# Patient Record
Sex: Male | Born: 1955 | Race: Black or African American | Hispanic: No | Marital: Married | Smoking: Never smoker
Health system: Southern US, Community
[De-identification: ages and names within clinical notes are randomized; demographics above are authoritative.]

## PROBLEM LIST (undated history)

## (undated) DIAGNOSIS — I10 Essential (primary) hypertension: Secondary | ICD-10-CM

---

## 1988-02-07 HISTORY — PX: INGUINAL HERNIA REPAIR: SUR1180

## 2010-09-08 ENCOUNTER — Inpatient Hospital Stay (INDEPENDENT_AMBULATORY_CARE_PROVIDER_SITE_OTHER)
Admission: RE | Admit: 2010-09-08 | Discharge: 2010-09-08 | Disposition: A | Discharge status: Home / Self Care | Payer: Federal, State, Local not specified - PPO | Source: Ambulatory Visit | Attending: Family Medicine | Admitting: Family Medicine

## 2010-09-08 DIAGNOSIS — R51 Headache: Secondary | ICD-10-CM

## 2010-09-08 DIAGNOSIS — R42 Dizziness and giddiness: Secondary | ICD-10-CM

## 2010-09-08 LAB — POCT I-STAT, CHEM 8
BUN: 15 mg/dL (ref 6–23)
Creatinine, Ser: 1 mg/dL (ref 0.50–1.35)
Sodium: 141 mEq/L (ref 135–145)
TCO2: 26 mmol/L (ref 0–100)

## 2019-03-28 ENCOUNTER — Ambulatory Visit: Payer: Federal, State, Local not specified - PPO | Attending: Internal Medicine

## 2019-03-28 DIAGNOSIS — Z23 Encounter for immunization: Secondary | ICD-10-CM | POA: Insufficient documentation

## 2019-03-28 NOTE — Progress Notes (Signed)
   Covid-19 Vaccination Clinic  Name:  Jermaine Oliver    MRN: [id number] DOB: 01-19-1956  03/28/2019  [name] was observed post Covid-19 immunization for 15 minutes without incidence. He was provided with Vaccine Information Sheet and instruction to access the V-Safe system.   [name] was instructed to call 911 with any severe reactions post vaccine: Marland Kitchen Difficulty breathing  . Swelling of your face and throat  . A fast heartbeat  . A bad rash all over your body  . Dizziness and weakness    Immunizations Administered    Name Date Dose VIS Date Route   Pfizer COVID-19 Vaccine 03/28/2019  1:27 PM 0.3 mL 01/17/2019 Intramuscular   Manufacturer: [employer], [employer]   Lot: [id number]   NDC: [address]-3

## 2019-04-22 ENCOUNTER — Ambulatory Visit: Payer: Federal, State, Local not specified - PPO | Attending: Internal Medicine

## 2019-04-22 DIAGNOSIS — Z23 Encounter for immunization: Secondary | ICD-10-CM

## 2019-04-22 NOTE — Progress Notes (Signed)
   Covid-19 Vaccination Clinic  Name:  Jermaine Oliver    MRN: [id number] DOB: 12/14/1955  04/22/2019  Mr. Jermaine Oliver was observed post Covid-19 immunization for 15 minutes without incident. He was provided with Vaccine Information Sheet and instruction to access the V-Safe system.   Mr. Jermaine Oliver was instructed to call 911 with any severe reactions post vaccine: Marland Kitchen Difficulty breathing  . Swelling of face and throat  . A fast heartbeat  . A bad rash all over body  . Dizziness and weakness   Immunizations Administered    Name Date Dose VIS Date Route   Pfizer COVID-19 Vaccine 04/22/2019  8:28 AM 0.3 mL 01/17/2019 Intramuscular   Manufacturer: [employer], [employer]   Lot: [id number]   NDC: [address]-8

## 2020-02-18 ENCOUNTER — Other Ambulatory Visit: Payer: Self-pay | Admitting: Family Medicine

## 2020-02-18 DIAGNOSIS — K4090 Unilateral inguinal hernia, without obstruction or gangrene, not specified as recurrent: Secondary | ICD-10-CM

## 2020-03-05 ENCOUNTER — Other Ambulatory Visit: Payer: Self-pay

## 2020-03-05 ENCOUNTER — Ambulatory Visit
Admission: RE | Admit: 2020-03-05 | Discharge: 2020-03-05 | Disposition: A | Discharge status: Home / Self Care | Payer: Federal, State, Local not specified - PPO | Source: Ambulatory Visit | Attending: Family Medicine | Admitting: Family Medicine

## 2020-03-05 DIAGNOSIS — K4090 Unilateral inguinal hernia, without obstruction or gangrene, not specified as recurrent: Secondary | ICD-10-CM

## 2020-04-05 ENCOUNTER — Ambulatory Visit: Payer: Self-pay | Admitting: Surgery

## 2020-04-05 ENCOUNTER — Encounter: Payer: Self-pay | Admitting: Surgery

## 2020-04-05 NOTE — H&P (Signed)
Jermaine Oliver Appointment: 04/05/2020 11:00 AM Location: [facility] Patient #: [id number] DOB: 02/12/1955 Married / Language: English / Race: Black or African American Male  History of Present Illness Ardeth Sportsman MD; 04/05/2020 11:31 AM) The patient is a 65 year old male who presents with an inguinal hernia. Note for "Inguinal hernia": ` ` ` Patient sent for surgical consultation at the request of Dr Knox Royalty  Chief Complaint: Left groin pain. Possible hernia. ` ` The patient is a patient with prior open right inguinal hernia repair by Dr. Dominga Ferry in the 1990s. Patient noted left groin pain and discomfort. Concern for hernia. Surgical consultation requested. Patient is a nonsmoker. 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 No sleep apnea diabetes.  Abdominal surgeries. Usually moves his bowels once or twice a day. Can walk at least half hour without difficulty. No nocturia or difficulty with urination. No history of prior infections. No history of cardiac or pulmonary issues. No history of stroke. No other abdominal surgeries beside his open inguinal hernia repair  (Review of systems as stated in this history (HPI) or in the review of systems. Otherwise all other 12 point ROS are negative) ` ` ###########################################`  This patient encounter took 30 minutes today to perform the following: obtain history, perform exam, review outside records, interpret tests & imaging, counsel the patient on their diagnosis; and, document this encounter, including findings & plan in the electronic health record (EHR).   Allergies (Alisha Spillers, CMA; 04/05/2020 10:47 AM) No Known Drug Allergies [04/05/2020]: Allergies Reconciled  Medication History (Alisha Spillers, CMA; 04/05/2020 10:47 AM) amLODIPine Besylate (10MG  Tablet, Oral) Active. Losartan Potassium-HCTZ (100-25MG  Tablet, Oral)  Active. Medications Reconciled    Vitals (Alisha Spillers CMA; 04/05/2020 10:48 AM) 04/05/2020 10:48 AM Weight: 141 lb Height: 804in Body Surface Area: 10.56 m Body Mass Index: 0.15 kg/m  Temp.: 98.77F(Oral)  Pulse: 82 (Regular)  Resp.: 18 (Unlabored)  P.OX: 99% (Room air) BP: 140/82(Sitting, Left Arm, Standard)        Physical Exam 04/07/2020 MD; 04/05/2020 11:25 AM)  General Mental Status-Alert. General Appearance-Not in acute distress, Not Sickly. Orientation-Oriented X3. Hydration-Well hydrated. Voice-Normal.  Integumentary Global Assessment Upon inspection and palpation of skin surfaces of the - Axillae: non-tender, no inflammation or ulceration, no drainage. and Distribution of scalp and body hair is normal. General Characteristics Temperature - normal warmth is noted.  Head and Neck Head-normocephalic, atraumatic with no lesions or palpable masses. Face Global Assessment - atraumatic, no absence of expression. Neck Global Assessment - no abnormal movements, no bruit auscultated on the right, no bruit auscultated on the left, no decreased range of motion, non-tender. Trachea-midline. Thyroid Gland Characteristics - non-tender.  Eye Eyeball - Left-Extraocular movements intact, No Nystagmus - Left. Eyeball - Right-Extraocular movements intact, No Nystagmus - Right. Cornea - Left-No Hazy - Left. Cornea - Right-No Hazy - Right. Sclera/Conjunctiva - Left-No scleral icterus, No Discharge - Left. Sclera/Conjunctiva - Right-No scleral icterus, No Discharge - Right. Pupil - Left-Direct reaction to light normal. Pupil - Right-Direct reaction to light normal.  ENMT Ears Pinna - Left - no drainage observed, no generalized tenderness observed. Pinna - Right - no drainage observed, no generalized tenderness observed. Nose and Sinuses External Inspection of the Nose - no destructive lesion observed. Inspection of the  nares - Left - quiet respiration. Inspection of the nares - Right - quiet respiration. Mouth and Throat Lips - Upper Lip - no fissures observed,  no pallor noted. Lower Lip - no fissures observed, no pallor noted. Nasopharynx - no discharge present. Oral Cavity/Oropharynx - Tongue - no dryness observed. Oral Mucosa - no cyanosis observed. Hypopharynx - no evidence of airway distress observed.  Chest and Lung Exam Inspection Movements - Normal and Symmetrical. Accessory muscles - No use of accessory muscles in breathing. Palpation Palpation of the chest reveals - Non-tender. Auscultation Breath sounds - Normal and Clear.  Cardiovascular Auscultation Rhythm - Regular. Murmurs & Other Heart Sounds - Auscultation of the heart reveals - No Murmurs and No Systolic Clicks.  Abdomen Inspection Inspection of the abdomen reveals - No Visible peristalsis and No Abnormal pulsations. Umbilicus - No Bleeding, No Urine drainage. Palpation/Percussion Palpation and Percussion of the abdomen reveal - Soft, Non Tender, No Rebound tenderness, No Rigidity (guarding) and No Cutaneous hyperesthesia. Note: Abdomen soft. Nontender. Not distended. No umbilical or incisional hernias. No guarding.  Male Genitourinary Sexual Maturity Tanner 5 - Adult hair pattern and Adult penile size and shape. Note: Obvious left inguinal hernia in groin only. Seems more lateral/indirect.  Mild bumpiness and right groin but no definite inguinal hernia there. Otherwise normal external male genitalia without any obvious testicular/epididymal masses.  Peripheral Vascular Upper Extremity Inspection - Left - No Cyanotic nailbeds - Left, Not Ischemic. Inspection - Right - No Cyanotic nailbeds - Right, Not Ischemic.  Neurologic Neurologic evaluation reveals -normal attention span and ability to concentrate, able to name objects and repeat phrases. Appropriate fund of knowledge , normal sensation and normal  coordination. Mental Status Affect - not angry, not paranoid. Cranial Nerves-Normal Bilaterally. Gait-Normal.  Neuropsychiatric Mental status exam performed with findings of-able to articulate well with normal speech/language, rate, volume and coherence, thought content normal with ability to perform basic computations and apply abstract reasoning and no evidence of hallucinations, delusions, obsessions or homicidal/suicidal ideation.  Musculoskeletal Global Assessment Spine, Ribs and Pelvis - no instability, subluxation or laxity. Right Upper Extremity - no instability, subluxation or laxity.  Lymphatic Head & Neck  General Head & Neck Lymphatics: Bilateral - Description - No Localized lymphadenopathy. Axillary  General Axillary Region: Bilateral - Description - No Localized lymphadenopathy. Femoral & Inguinal  Generalized Femoral & Inguinal Lymphatics: Left - Description - No Localized lymphadenopathy. Right - Description - No Localized lymphadenopathy.    Assessment & Plan Ardeth Sportsman MD; 04/05/2020 11:26 AM)  INGUINAL HERNIA OF LEFT SIDE WITHOUT OBSTRUCTION OR GANGRENE (K40.90) Impression: Small but definite left inguinal hernia. No evidence of recurrence on the right side status post open repair decades ago.  I think he would benefit from hernia repair. Reasonable for laparoscopic candidate. Double check to ensure no evidence of subtle recurrence or a femoral hernia on the right side. He is interested in proceeding. We'll work to coordinate convenient time.  He works in the post office using a upright lift. Sounds like moderate activity. Noted that most people can go back to light duty around 3 weeks and unrestricted around 6 weeks. We will see.   PREOP - ING HERNIA - ENCOUNTER FOR PREOPERATIVE EXAMINATION FOR GENERAL SURGICAL PROCEDURE (Z01.818)  Current Plans You are being scheduled for surgery- Our schedulers will call you.  You should hear from our  office's scheduling department within 5 working days about the location, date, and time of surgery. We try to make accommodations for patient's preferences in scheduling surgery, but sometimes the OR schedule or the surgeon's schedule prevents [place] from making those accommodations.  If you have not heard  from our office [phone]) in 5 working days, call the office and ask for your surgeon's nurse.  If you have other questions about your diagnosis, plan, or surgery, call the office and ask for your surgeon's nurse.  Written instructions provided The anatomy & physiology of the abdominal wall and pelvic floor was discussed. The pathophysiology of hernias in the inguinal and pelvic region was discussed. Natural history risks such as progressive enlargement, pain, incarceration, and strangulation was discussed. Contributors to complications such as smoking, obesity, diabetes, prior surgery, etc were discussed.  I feel the risks of no intervention will lead to serious problems that outweigh the operative risks; therefore, I recommended surgery to reduce and repair the hernia. I explained laparoscopic techniques with possible need for an open approach. I noted usual use of mesh to patch and/or buttress hernia repair  Risks such as bleeding, infection, abscess, need for further treatment, heart attack, death, and other risks were discussed. I noted a good likelihood this will help address the problem. Goals of post-operative recovery were discussed as well. Possibility that this will not correct all symptoms was explained. I stressed the importance of low-impact activity, aggressive pain control, avoiding constipation, & not pushing through pain to minimize risk of post-operative chronic pain or injury. Possibility of reherniation was discussed. We will work to minimize complications.  An educational handout further explaining the pathology & treatment options was given as well. Questions  were answered. The patient expresses understanding & wishes to proceed with surgery.  Pt Education - Pamphlet Given - Laparoscopic Hernia Repair: discussed with patient and provided information. Pt Education - [facility] Pain Control (Berton Butrick) Pt Education - [facility] Hernia Post-Op HCI (Shabree Tebbetts): discussed with patient and provided information. Pt Education - [facility] Mesh education: discussed with patient and provided information.  Ardeth Sportsman, MD, FACS, MASCRS  Gastrointestinal and Minimally Invasive Surgery  [facility] [address], [address] [place], [place] [address] [phone] Fax [phone] [address]/Paging  CONTACT INFORMATION: Weekday (9AM-5PM) concerns: Call [facility] main office at [phone] Weeknight (5PM-9AM) or Weekend/Holiday concerns: Check [email or web address] for General Surgery [facility] coverage (Please, do not use SecureChat as it is not reliable communication to operating surgeons for immediate patient care)

## 2020-06-23 SURGERY — IRRIGATION AND DEBRIDEMENT EXTREMITY
Anesthesia: Regional | Laterality: Right

## 2021-05-06 ENCOUNTER — Encounter (HOSPITAL_COMMUNITY): Payer: Self-pay | Admitting: Emergency Medicine

## 2021-05-06 ENCOUNTER — Emergency Department (HOSPITAL_COMMUNITY)
Admission: EM | Admit: 2021-05-06 | Discharge: 2021-05-07 | Disposition: A | Discharge status: Home / Self Care | Payer: Federal, State, Local not specified - PPO | Attending: Emergency Medicine | Admitting: Emergency Medicine

## 2021-05-06 ENCOUNTER — Other Ambulatory Visit: Payer: Self-pay

## 2021-05-06 DIAGNOSIS — R42 Dizziness and giddiness: Secondary | ICD-10-CM | POA: Diagnosis not present

## 2021-05-06 DIAGNOSIS — I10 Essential (primary) hypertension: Secondary | ICD-10-CM | POA: Diagnosis present

## 2021-05-06 HISTORY — DX: Essential (primary) hypertension: I10

## 2021-05-06 LAB — URINALYSIS, ROUTINE W REFLEX MICROSCOPIC
Bacteria, UA: NONE SEEN
Bilirubin Urine: NEGATIVE
Glucose, UA: NEGATIVE mg/dL
Ketones, ur: NEGATIVE mg/dL
Leukocytes,Ua: NEGATIVE
Nitrite: NEGATIVE
Protein, ur: NEGATIVE mg/dL
Specific Gravity, Urine: 1.013 (ref 1.005–1.030)
pH: 5 (ref 5.0–8.0)

## 2021-05-06 LAB — BASIC METABOLIC PANEL
Anion gap: 13 (ref 5–15)
BUN: 15 mg/dL (ref 8–23)
CO2: 25 mmol/L (ref 22–32)
Calcium: 9.3 mg/dL (ref 8.9–10.3)
Chloride: 98 mmol/L (ref 98–111)
Creatinine, Ser: 1.22 mg/dL (ref 0.61–1.24)
GFR, Estimated: >60 mL/min (ref >60)
Glucose, Bld: 94 mg/dL (ref 70–99)
Potassium: 3.1 mmol/L — ABNORMAL LOW (ref 3.5–5.1)
Sodium: 136 mmol/L (ref 135–145)

## 2021-05-06 LAB — CBC WITH DIFFERENTIAL/PLATELET
Abs Immature Granulocytes: 0.03 10*3/uL (ref 0.00–0.07)
Basophils Absolute: 0 10*3/uL (ref 0.0–0.1)
Basophils Relative: 0 %
Eosinophils Absolute: 0.8 10*3/uL — ABNORMAL HIGH (ref 0.0–0.5)
Eosinophils Relative: 9 %
HCT: 40.6 % (ref 39.0–52.0)
Hemoglobin: 13 g/dL (ref 13.0–17.0)
Immature Granulocytes: 0 %
Lymphocytes Relative: 21 %
Lymphs Abs: 1.7 10*3/uL (ref 0.7–4.0)
MCH: 23.1 pg — ABNORMAL LOW (ref 26.0–34.0)
MCHC: 32 g/dL (ref 30.0–36.0)
MCV: 72.2 fL — ABNORMAL LOW (ref 80.0–100.0)
Monocytes Absolute: 0.7 10*3/uL (ref 0.1–1.0)
Monocytes Relative: 8 %
Neutro Abs: 5.1 10*3/uL (ref 1.7–7.7)
Neutrophils Relative %: 62 %
Platelets: 316 10*3/uL (ref 150–400)
RBC: 5.62 MIL/uL (ref 4.22–5.81)
RDW: 15.5 % (ref 11.5–15.5)
WBC: 8.2 10*3/uL (ref 4.0–10.5)
nRBC: 0 % (ref 0.0–0.2)

## 2021-05-06 NOTE — ED Provider Triage Note (Signed)
Emergency Medicine Provider Triage Evaluation Note ? ?Jermaine Oliver , a 66 y.o. male  was evaluated in triage.  Pt complains of high blood pressure.  Patient states that he is at home and record his blood pressure which was 171/100.  Patient states that he checks his blood pressure every night, noticed it was high tonight.  Patient presented to ED for evaluation.  Patient complaining of lightheadedness.  Patient denies any chest pain, shortness of breath, nausea, vomiting, blurred vision, headache. ? ?Review of Systems  ?Positive: Lightheaded ?Negative: Chest pain, shortness of breath, headache, blurred vision, back pain, nausea, vomiting ? ?Physical Exam  ?Ht 5\' 7"  (1.702 m)   Wt 64.9 kg   BMI 22.40 kg/m?  ?Gen:   Awake, no distress   ?Resp:  Normal effort  ?MSK:   Moves extremities without difficulty  ?Other:  No focal neurodeficits noted ? ?Medical Decision Making  ?Medically screening exam initiated at 9:53 PM.  Appropriate orders placed.  Jermaine Oliver was informed that the remainder of the evaluation will be completed by another provider, this initial triage assessment does not replace that evaluation, and the importance of remaining in the ED until their evaluation is complete. ? ? ?  ?Azucena Cecil, PA-C ?05/06/21 2155 ? ?

## 2021-05-06 NOTE — ED Triage Notes (Signed)
Pt c/o hypertension since this AM. Highest BP 171/100. Pt states that he is compliant with his medications ?

## 2021-05-07 NOTE — ED Provider Notes (Signed)
?[facility]ENCY DEPT ?[facility] Emergency Department ?Provider Note ?MRN:  [id number]  ?Arrival date & time: 05/07/21    ? ?Chief Complaint   ?Hypertension ?  ?History of Present Illness   ?Jermaine Oliver is a 66 y.o. year-old male with a history of hypertension presenting to the ED with chief complaint of hypertension. ? ?Elevated blood pressure throughout the day today.  Noted some mild lightheadedness occasionally.  Here for evaluation.  No chest pain, no shortness of breath, no recent illnesses or fever, no numbness or weakness to the arms or legs ? ?Review of Systems  ?A thorough review of systems was obtained and all systems are negative except as noted in the HPI and PMH.  ? ?Patient's Health History   ? ?Past Medical History:  ?Diagnosis Date  ? Hypertension   ?  ?Past Surgical History:  ?Procedure Laterality Date  ? INGUINAL HERNIA REPAIR Right 1990  ? Dr Lurene Shadow  ?  ?No family history on file.  ?Social History  ? ?Socioeconomic History  ? Marital status: Married  ?  Spouse name: Not on file  ? Number of children: Not on file  ? Years of education: Not on file  ? Highest education level: Not on file  ?Occupational History  ? Not on file  ?Tobacco Use  ? Smoking status: Never  ? Smokeless tobacco: Never  ?Substance and Sexual Activity  ? Alcohol use: Not Currently  ? Drug use: Not Currently  ? Sexual activity: Not on file  ?Other Topics Concern  ? Not on file  ?Social History Narrative  ? Not on file  ? ?Social Determinants of Health  ? ?Financial Resource Strain: Not on file  ?Food Insecurity: Not on file  ?Transportation Needs: Not on file  ?Physical Activity: Not on file  ?Stress: Not on file  ?Social Connections: Not on file  ?Intimate Partner Violence: Not on file  ?  ? ?Physical Exam  ? ?Vitals:  ? 05/07/21 0115 05/07/21 0130  ?BP: 139/80 135/77  ?Pulse: 63 62  ?Resp: 15 16  ?Temp:    ?SpO2: 97% 98%  ?  ?CONSTITUTIONAL: Well-appearing, NAD ?NEURO/PSYCH:  Alert and oriented x 3, normal and  symmetric strength and sensation, normal coordination, normal speech ?EYES:  eyes equal and reactive ?ENT/NECK:  no LAD, no JVD ?CARDIO: Regular rate, well-perfused, normal S1 and S2 ?PULM:  CTAB no wheezing or rhonchi ?GI/GU:  non-distended, non-tender ?MSK/SPINE:  No gross deformities, no edema ?SKIN:  no rash, atraumatic ? ? ?*Additional and/or pertinent findings included in MDM below ? ?Diagnostic and Interventional Summary  ? ? EKG Interpretation ? ?Date/Time:  Saturday May 07 2021 00:49:52 EDT ?Ventricular Rate:  61 ?PR Interval:  242 ?QRS Duration: 99 ?QT Interval:  438 ?QTC Calculation: 442 ?R Axis:   11 ?Text Interpretation: Sinus rhythm Prolonged PR interval Confirmed by Kennis Carina [phone]) on 05/07/2021 1:35:04 AM ?  ? ?  ? ?Labs Reviewed  ?CBC WITH DIFFERENTIAL/PLATELET - Abnormal; Notable for the following components:  ?    Result Value  ? MCV 72.2 (*)   ? MCH 23.1 (*)   ? Eosinophils Absolute 0.8 (*)   ? All other components within normal limits  ?BASIC METABOLIC PANEL - Abnormal; Notable for the following components:  ? Potassium 3.1 (*)   ? All other components within normal limits  ?URINALYSIS, ROUTINE W REFLEX MICROSCOPIC - Abnormal; Notable for the following components:  ? Hgb urine dipstick SMALL (*)   ? All other components within  normal limits  ?  ?No orders to display  ?  ?Medications - No data to display  ? ?Procedures  /  Critical Care ?Procedures ? ?ED Course and Medical Decision Making  ?Initial Impression and Ddx ?Largely a visit for asymptomatic hypertension.  Mild lightheadedness, no headache, no vision change, completely normal neurological exam, no chest pain or shortness of breath.  Screening EKG and labs are reassuring, no significant blood count or electrolyte disturbance.  Appropriate for discharge with reassurance. ? ?Past medical/surgical history that increases complexity of ED encounter: Hypertension ? ?Interpretation of Diagnostics ?I personally reviewed the EKG and my  interpretation is as follows: Sinus rhythm ?   ? ? ?Patient Reassessment and Ultimate Disposition/Management ?Discharge home ? ?Patient management required discussion with the following services or consulting groups:  None ? ?Complexity of Problems Addressed ?Acute illness or injury that poses threat of life of bodily function ? ?Additional Data Reviewed and Analyzed ?Further history obtained from: ?Past medical history and medications listed in the EMR ? ?Additional Factors Impacting ED Encounter Risk ?None ? ?Elmer Sow. Pilar Plate, MD ?[facility] Emergency Medicine ?[facility] [email or web address] ? ?Final Clinical Impressions(s) / ED Diagnoses  ? ?  ICD-10-CM   ?1. Hypertension, unspecified type  I10   ?  ?  ?ED Discharge Orders   ? ? None  ? ?  ?  ? ?Discharge Instructions Discussed with and Provided to Patient:  ? ? ?Discharge Instructions   ? ?  ?You were evaluated in the Emergency Department and after careful evaluation, we did not find any emergent condition requiring admission or further testing in the hospital. ? ?Your exam/testing today was overall reassuring.  Recommend follow-up with your primary care doctor to discuss your symptoms and blood pressure. ? ?Please return to the Emergency Department if you experience any worsening of your condition.  Thank you for allowing [place] to be a part of your care. ? ? ? ? ?  ?Sabas Sous, MD ?05/07/21 [phone] ? ?

## 2021-05-07 NOTE — Discharge Instructions (Signed)
You were evaluated in the Emergency Department and after careful evaluation, we did not find any emergent condition requiring admission or further testing in the hospital. ? ?Your exam/testing today was overall reassuring.  Recommend follow-up with your primary care doctor to discuss your symptoms and blood pressure. ? ?Please return to the Emergency Department if you experience any worsening of your condition.  Thank you for allowing [place] to be a part of your care. ? ?

## 2022-06-04 IMAGING — CT CT ABD-PELV W/O CM
1 of 2 series · 14 of 32 positions shown, 19 images · non-contrast
Comparison: None.

CLINICAL DATA: Evaluate for left inguinal hernia. Complains of
burning sensation in left groin region with some swelling.

EXAM:
CT ABDOMEN AND PELVIS WITHOUT CONTRAST
TECHNIQUE: Multidetector CT imaging of the abdomen and pelvis was performed
following the standard protocol without IV contrast.

[Series 2: abd/pelvis w/(date) · axial · 0.67mm/px · z∈[-465,-60]mm · 14 of 93 slices shown, 19 images]
[im 6/93  soft-tissue]
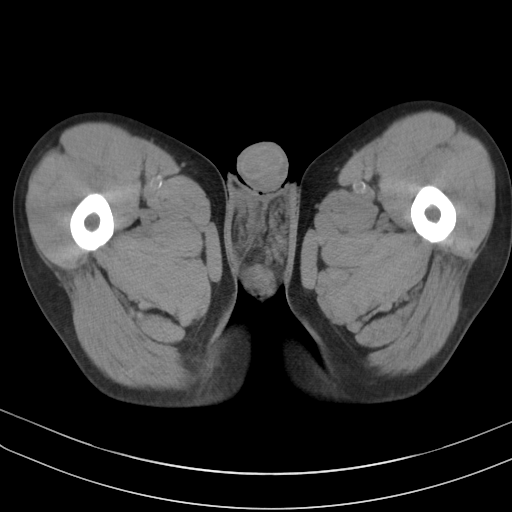
[im 6/93  bone]
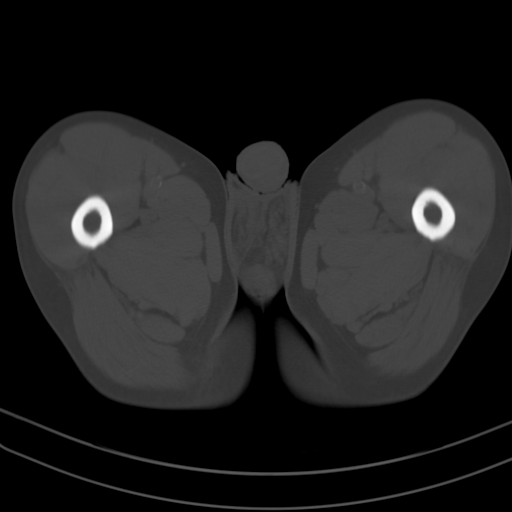
[im 11/93  soft-tissue]
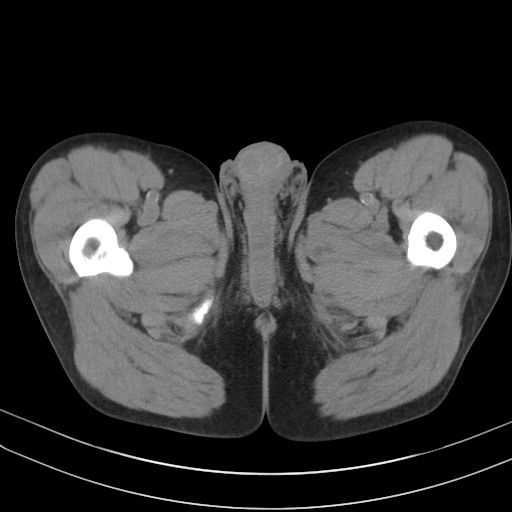
[im 21/93  soft-tissue]
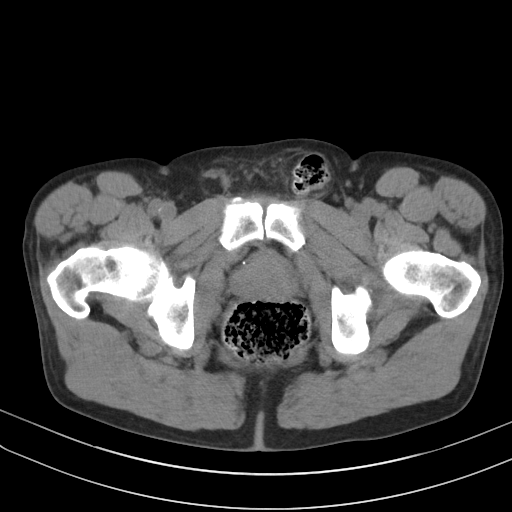
[im 26/93  soft-tissue]
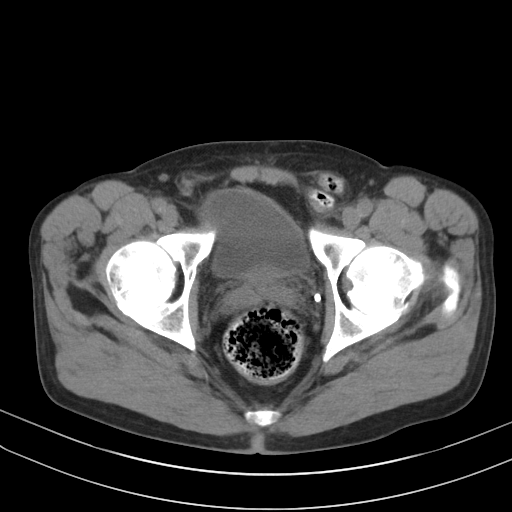
[im 31/93  soft-tissue]
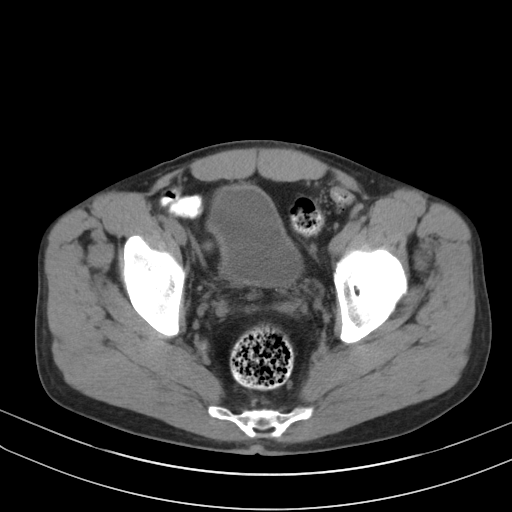
[im 41/93  soft-tissue]
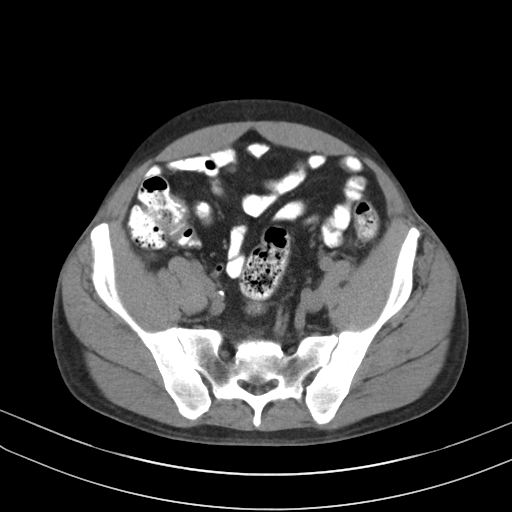
[im 47/93  soft-tissue]
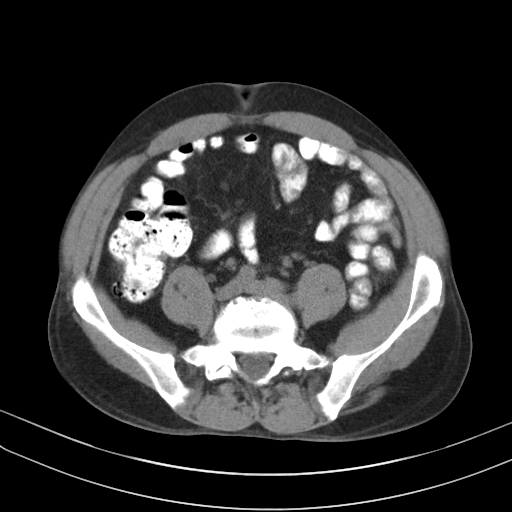
[im 52/93  soft-tissue]
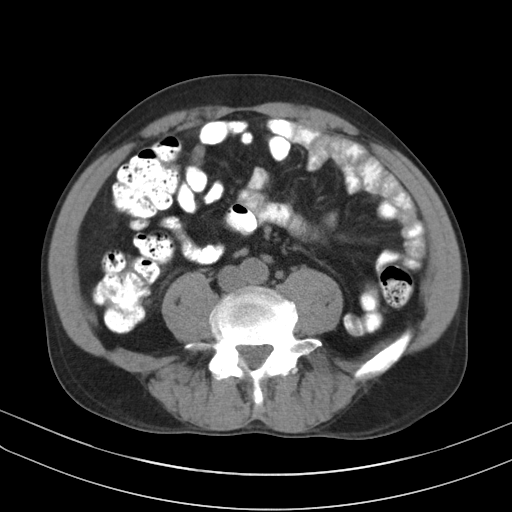
[im 62/93  soft-tissue]
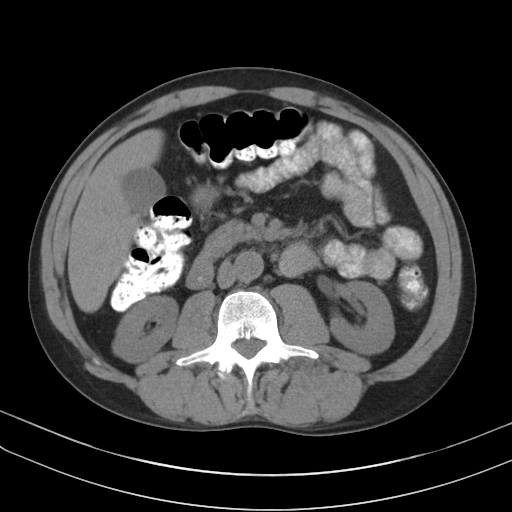
[im 62/93  bone]
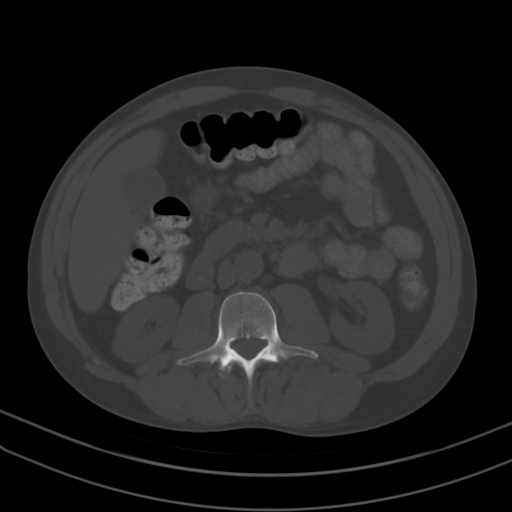
[im 67/93  soft-tissue]
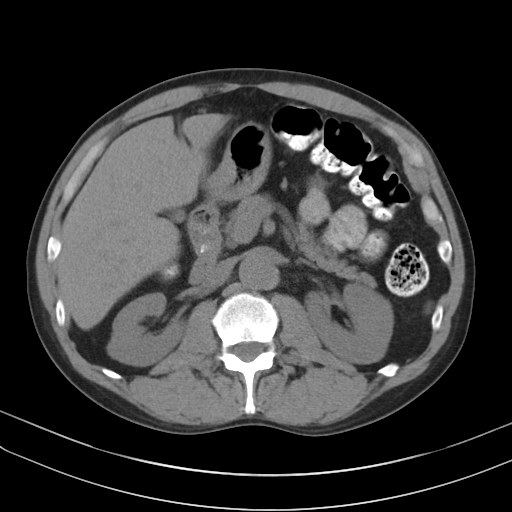
[im 72/93  soft-tissue]
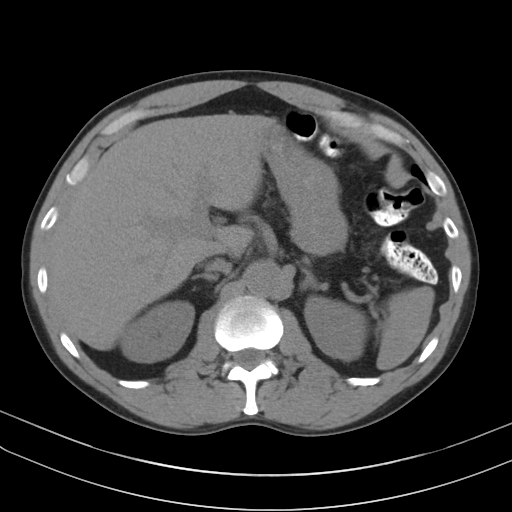
[im 72/93  lung]
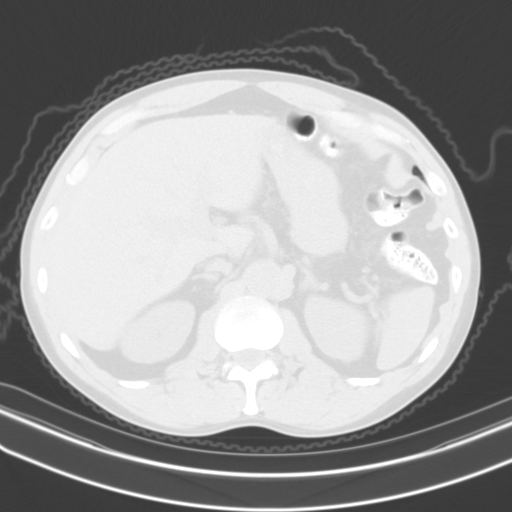
[im 77/93  lung]
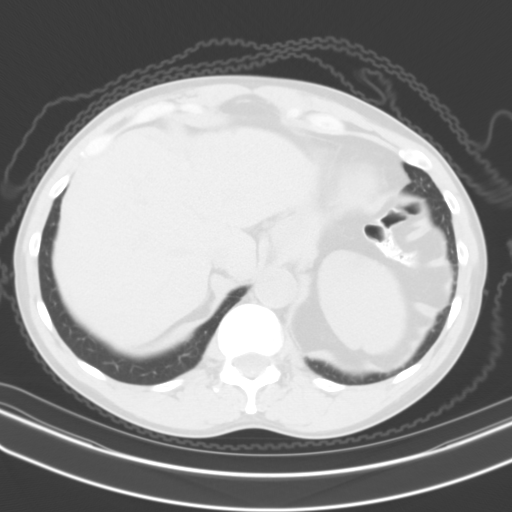
[im 82/93  soft-tissue]
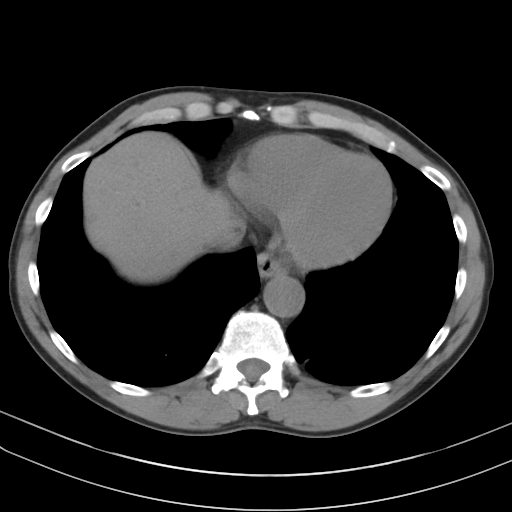
[im 82/93  lung]
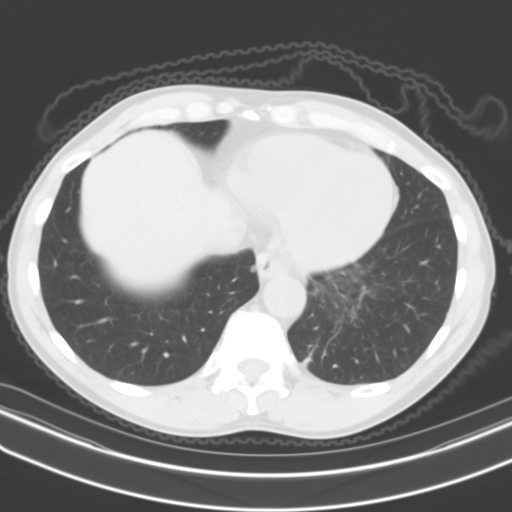
[im 87/93  soft-tissue]
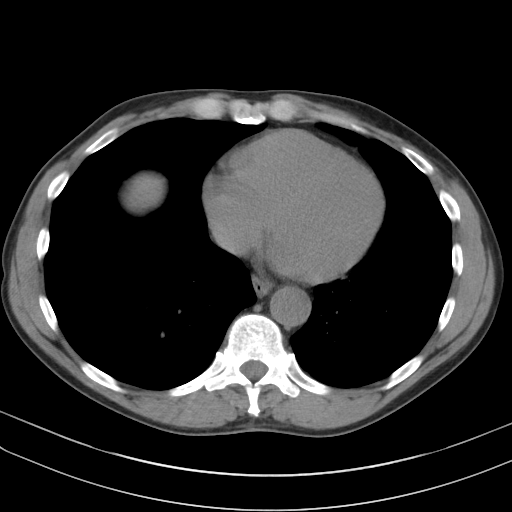
[im 87/93  lung]
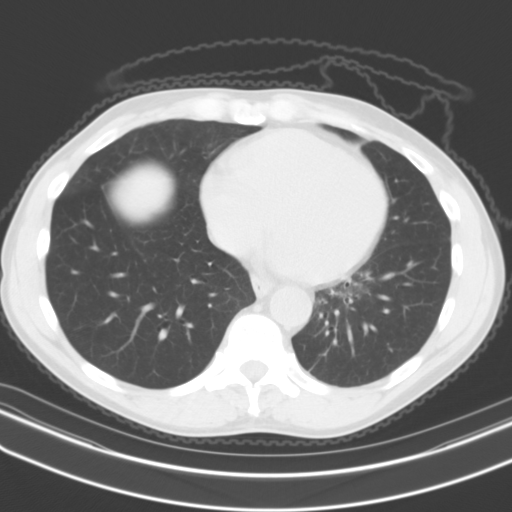

[14 of 32 positions shown; findings below may reference images not displayed]

FINDINGS: Lower chest: Scarring identified within the medial left lung base.
No pleural effusion or airspace consolidation within the imaged
portions of the lungs.

Hepatobiliary: 6 mm low-attenuation structure in the posterior right
hepatic lobe is too small to characterize. No suspicious liver
lesions. The gallbladder is normal. No biliary dilatation.

Pancreas: Unremarkable. No pancreatic ductal dilatation or
surrounding inflammatory changes.

Spleen: Normal in size without focal abnormality.

Adrenals/Urinary Tract: Normal appearance of the adrenal glands. No
kidney mass or hydronephrosis identified. Urinary bladder appears
normal.

Stomach/Bowel: The stomach appears nondistended. The appendix is
visualized and is within normal limits. No small bowel wall
thickening, inflammation or distension. There is a left inguinal
hernia which contains a nonobstructed loop of sigmoid colon, image
37/3. There is sigmoid diverticulosis without signs of acute
inflammation. A moderate amount of stool is noted within the distal
sigmoid colon and rectum.

Vascular/Lymphatic: Mild aortic atherosclerosis. No aneurysm. No
abdominopelvic adenopathy identified.

Reproductive: Prostate is unremarkable.

Other: No free fluid or fluid collections.

Musculoskeletal: Degenerative disc disease noted within the lumbar
spine. Most advanced at L4-5 and L5-S1.
IMPRESSION: 1. Left inguinal hernia contains a nonobstructed loop of sigmoid
colon.
2. Lumbar spondylosis.
3. Aortic atherosclerosis.

Aortic Atherosclerosis ([id number]).
